# Patient Record
Sex: Female | Born: 1950 | Race: Black or African American | Hispanic: No | Marital: Married
Health system: Southern US, Community
[De-identification: ages and names within clinical notes are randomized; demographics above are authoritative.]

---

## 2011-11-05 ENCOUNTER — Encounter: Payer: Self-pay | Admitting: Pulmonary Disease

## 2012-05-04 ENCOUNTER — Emergency Department: Payer: Self-pay | Admitting: Emergency Medicine

## 2013-04-24 IMAGING — CR DG KNEE COMPLETE 4+V*L*
1 series · 4 of 4 positions shown · non-contrast
Comparison: none

REASON FOR EXAM: pa[REDACTED]rease mobility sp fall
COMMENTS:

PROCEDURE:     DXR - DXR KNEE LT COMP WITH OBLIQUES  - May 04, 2012 [DATE]
RESULT:     Comparison:  None

[Series 1: t knee ap left · 0.14mm/px · 4 of 4 slices shown]
[im 1/4]
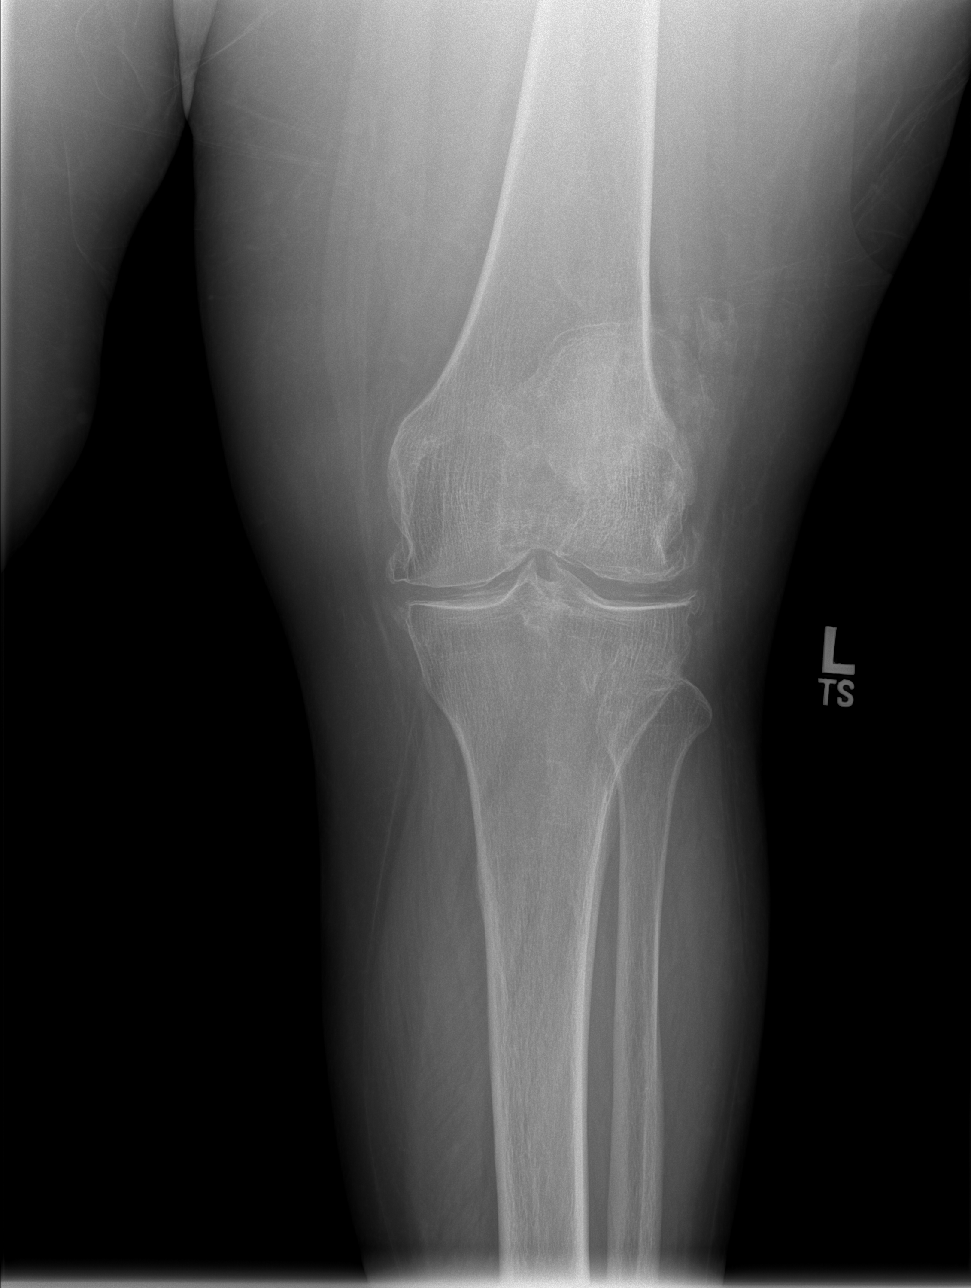
[im 2/4]
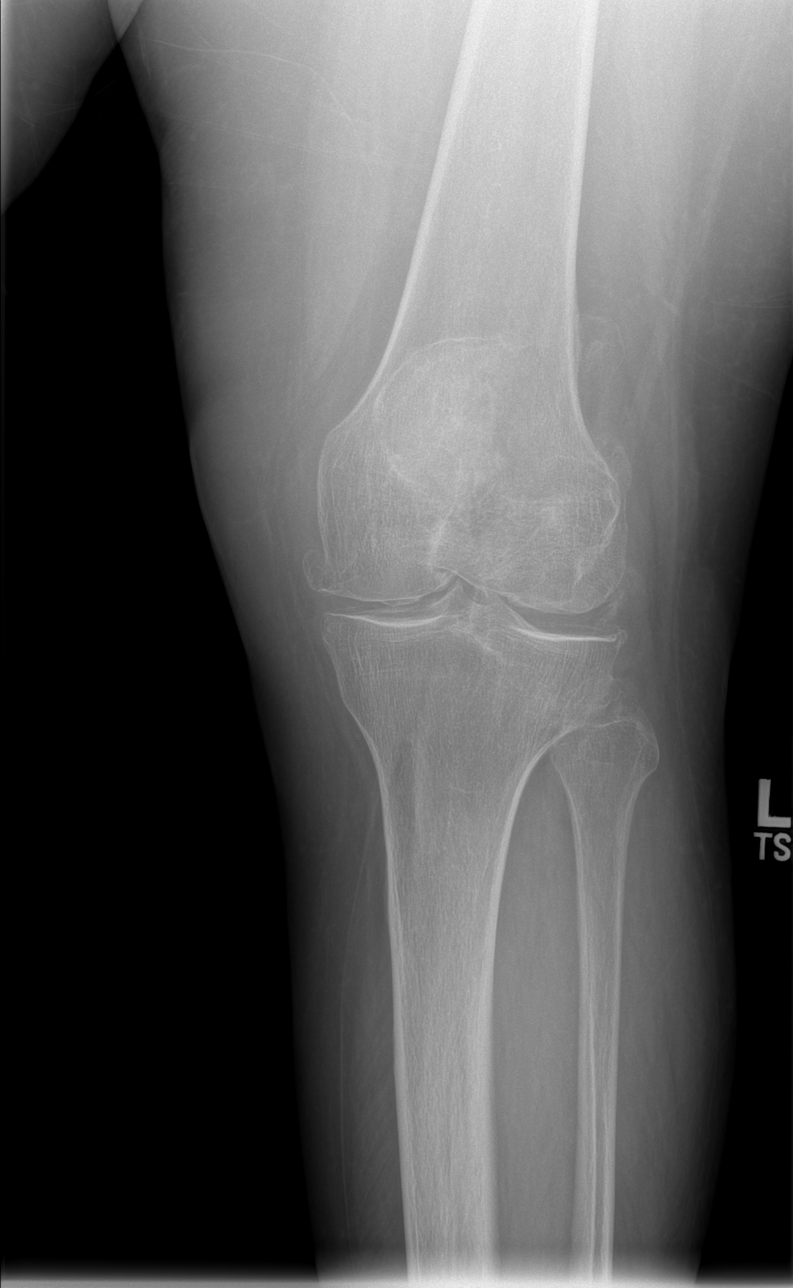
[im 3/4]
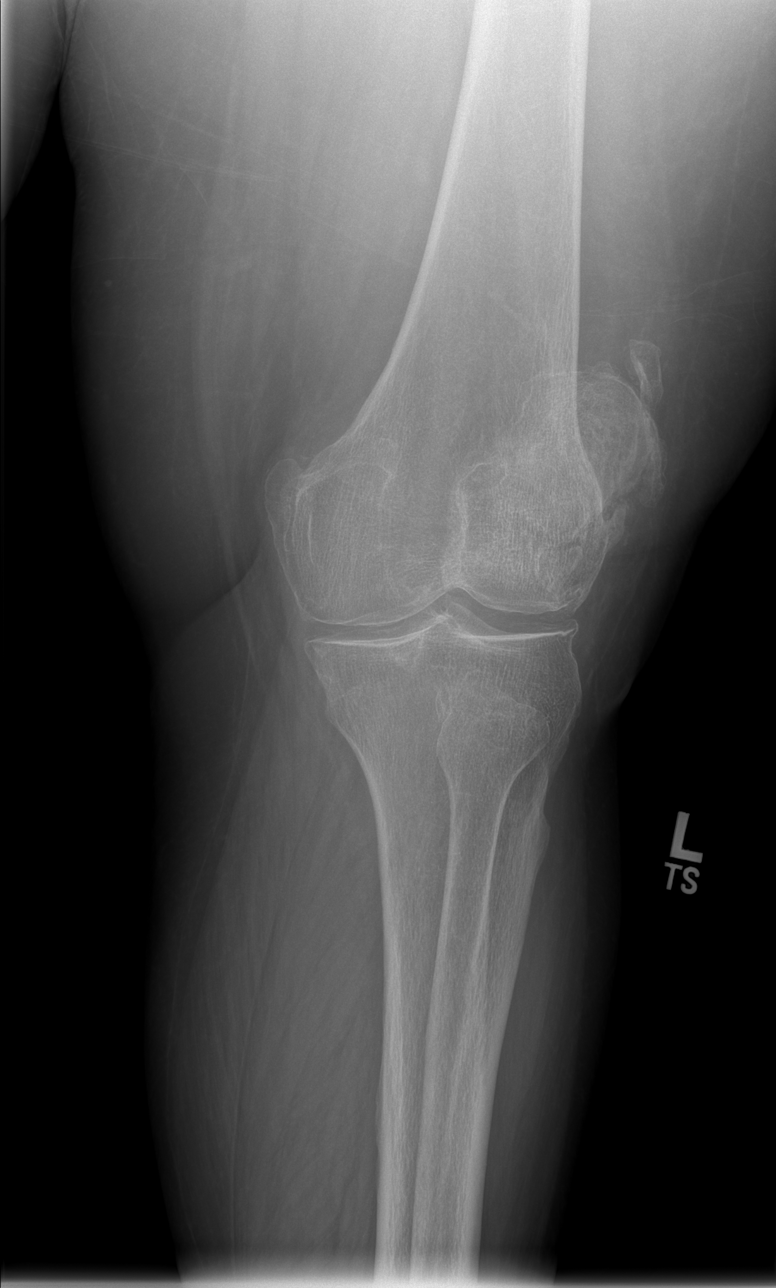
[im 4/4]
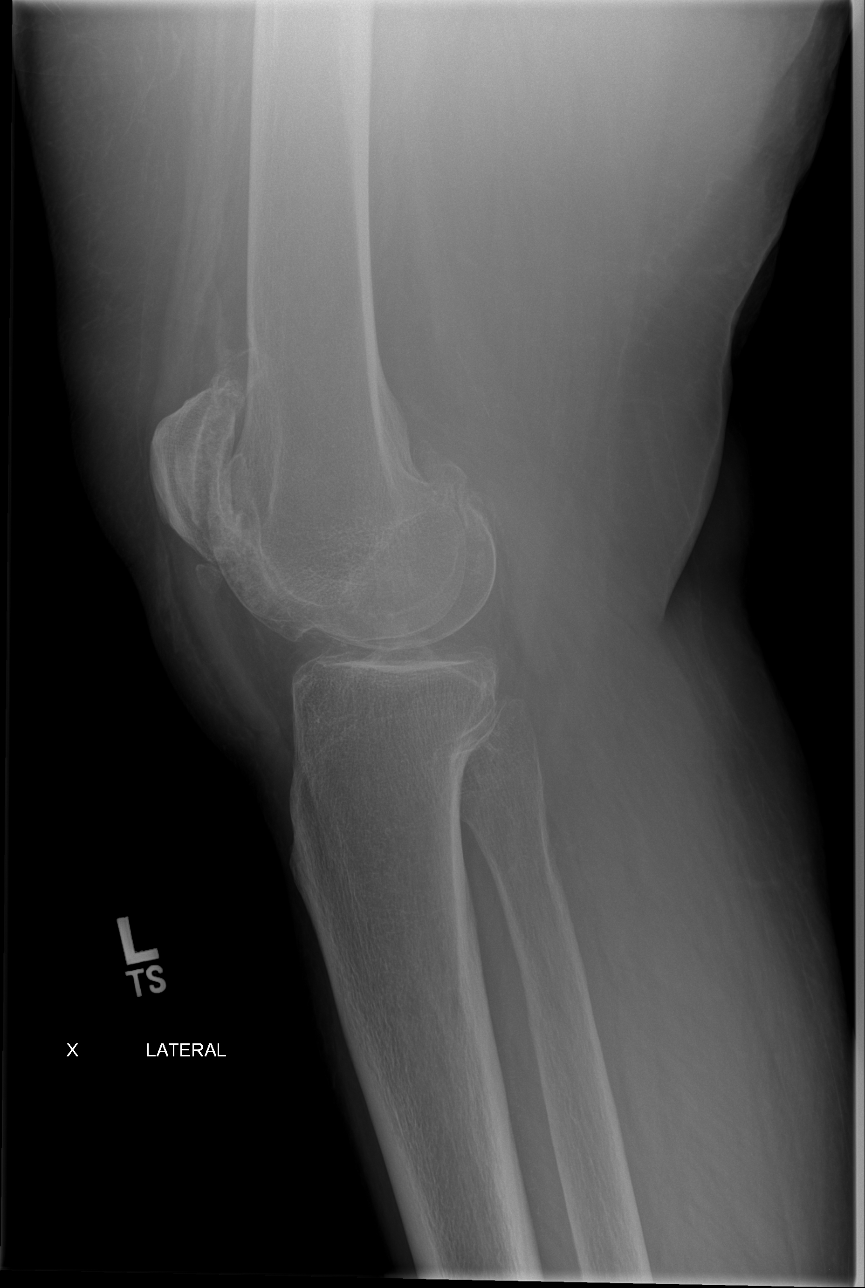

[4 of 4 positions shown; findings below may reference images not displayed]

FINDINGS: 4 views of the left knee demonstrates no acute fracture or dislocation.
There is no significant joint effusion. There are tricompartmental
degenerative changes most severe in the patellofemoral compartment. There is
chondrocalcinosis of the tibiofemoral compartment as can be seen with CPPD.
IMPRESSION: No acute osseous injury of the left knee.

[REDACTED]

## 2019-09-20 ENCOUNTER — Other Ambulatory Visit: Payer: Self-pay

## 2019-09-20 DIAGNOSIS — Z20822 Contact with and (suspected) exposure to covid-19: Secondary | ICD-10-CM

## 2019-09-22 ENCOUNTER — Telehealth: Payer: Self-pay

## 2019-09-22 LAB — NOVEL CORONAVIRUS, NAA: SARS-CoV-2, NAA: NOT DETECTED

## 2019-09-22 NOTE — Telephone Encounter (Signed)
Caller given negative result and verbalized understanding  

## 2022-11-21 ENCOUNTER — Ambulatory Visit (LOCAL_COMMUNITY_HEALTH_CENTER): Payer: 59

## 2022-11-21 ENCOUNTER — Other Ambulatory Visit: Payer: Self-pay

## 2022-11-21 DIAGNOSIS — Z111 Encounter for screening for respiratory tuberculosis: Secondary | ICD-10-CM

## 2022-11-24 ENCOUNTER — Ambulatory Visit (LOCAL_COMMUNITY_HEALTH_CENTER): Payer: 59

## 2022-11-24 DIAGNOSIS — Z111 Encounter for screening for respiratory tuberculosis: Secondary | ICD-10-CM

## 2022-11-24 LAB — TB SKIN TEST
Induration: 0 mm
TB Skin Test: NEGATIVE

## 2023-12-01 ENCOUNTER — Other Ambulatory Visit: Payer: 59

## 2024-08-15 ENCOUNTER — Other Ambulatory Visit

## 2024-08-16 ENCOUNTER — Ambulatory Visit (LOCAL_COMMUNITY_HEALTH_CENTER): Payer: Self-pay

## 2024-08-16 DIAGNOSIS — Z111 Encounter for screening for respiratory tuberculosis: Secondary | ICD-10-CM

## 2024-08-18 ENCOUNTER — Other Ambulatory Visit

## 2024-08-18 ENCOUNTER — Ambulatory Visit

## 2024-08-18 DIAGNOSIS — Z111 Encounter for screening for respiratory tuberculosis: Secondary | ICD-10-CM

## 2024-08-18 LAB — TB SKIN TEST
Induration: 0 mm
TB Skin Test: NEGATIVE
